# Patient Record
Sex: Male | Born: 1995 | Race: Black or African American | Hispanic: No | Marital: Single | Smoking: Never smoker
Health system: Southern US, Community
[De-identification: ages and names within clinical notes are randomized; demographics above are authoritative.]

## PROBLEM LIST (undated history)

## (undated) DIAGNOSIS — Z789 Other specified health status: Secondary | ICD-10-CM

## (undated) HISTORY — PX: NO PAST SURGERIES: SHX2092

---
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
## 2018-06-13 ENCOUNTER — Ambulatory Visit: Payer: Self-pay | Admitting: Family Medicine

## 2018-06-13 VITALS — BP 105/70 | HR 89 | Temp 98.8°F | Resp 16 | Wt 117.6 lb

## 2018-06-13 DIAGNOSIS — B081 Molluscum contagiosum: Secondary | ICD-10-CM

## 2018-06-13 NOTE — Progress Notes (Signed)
Nicholas Barr is a 22 y.o. male who presents today with concerns of a rash on his belly button, under his arms, and the most recent lesion on his penis. He denies risk for STI/STD and reports a steady girlfriend- who is symptom free.  Review of Systems  Constitutional: Negative for chills, fever and malaise/fatigue.  HENT: Negative for congestion, ear discharge, ear pain, sinus pain and sore throat.   Eyes: Negative.   Respiratory: Negative for cough, sputum production and shortness of breath.   Cardiovascular: Negative.  Negative for chest pain.  Gastrointestinal: Negative for abdominal pain, diarrhea, nausea and vomiting.  Genitourinary: Negative for dysuria, frequency, hematuria and urgency.  Musculoskeletal: Negative for myalgias.  Skin: Positive for itching and rash.  Neurological: Negative for headaches.  Endo/Heme/Allergies: Negative.   Psychiatric/Behavioral: Negative.     O: Vitals:   06/13/18 1723  BP: 105/70  Pulse: 89  Resp: 16  Temp: 98.8 F (37.1 C)  SpO2: 96%     Physical Exam  Constitutional: He is oriented to person, place, and time. Vital signs are normal. He appears well-developed and well-nourished. He is active.  Non-toxic appearance. He does not have a sickly appearance.  HENT:  Head: Normocephalic.  Right Ear: Hearing, tympanic membrane, external ear and ear canal normal.  Left Ear: Hearing, tympanic membrane, external ear and ear canal normal.  Nose: Nose normal.  Mouth/Throat: Uvula is midline and oropharynx is clear and moist.  Neck: Normal range of motion. Neck supple.  Cardiovascular: Normal rate, regular rhythm, normal heart sounds and normal pulses.  Pulmonary/Chest: Effort normal and breath sounds normal.  Abdominal: Soft. Bowel sounds are normal.  Genitourinary: Testes normal. Circumcised. No phimosis, paraphimosis, hypospadias or penile erythema. No discharge found.     Genitourinary Comments: Small non tender flesh colored lesions  consistent with central dimple and raised non crusty non plaque like vesicular edges- thigh lesions have ecchymotic appearance.  Musculoskeletal: Normal range of motion.  Lymphadenopathy:       Head (right side): No submental and no submandibular adenopathy present.       Head (left side): No submental and no submandibular adenopathy present.    He has no cervical adenopathy.  Neurological: He is alert and oriented to person, place, and time.  Skin:     Psychiatric: He has a normal mood and affect.  Vitals reviewed.    A: 1. Molluscum contagiosum      P: Discussed exam findings, diagnosis etiology and medication use and indications reviewed with patient. Follow- Up and discharge instructions provided. No emergent/urgent issues found on exam.  Patient verbalized understanding of information provided and agrees with plan of care (POC), all questions answered.  1. Molluscum contagiosum Discussed treatment plan and options and advised other locations to seek definitive treatment at location that can perform cryotherapy, etc.  Other orders - Multiple Vitamin (MULTIVITAMIN WITH MINERALS) TABS tablet; Take 1 tablet by mouth daily.

## 2018-06-13 NOTE — Patient Instructions (Signed)
Molluscum Contagiosum, Adult Molluscum contagiosum is a skin infection that can cause a rash. When molluscum contagiosum affects the genital area, it is called a sexually transmitted disease (STD). What are the causes? Molluscum contagiosum is caused by a virus. The virus can spread easily from person to person through:  Skin-to-skin contact with an infected person.  Contact with an infected object, such as a towel or clothing.  What increases the risk? You may be at higher risk for molluscum contagiosum if you:  Live in an area where the weather is moist and warm.  Have a weak body defense system (immune system).  What are the signs or symptoms? The main symptom is a rash that appears 2-7 weeks after exposure to the virus. It is made up of 2-20 small, firm, dome-shaped bumps that may:  Be pink or flesh-colored.  Appear alone or in groups.  Range from the size of a pinhead to the size of a pencil eraser.  Feel smooth and waxy.  Have a pit in the middle.  Itch. The rash does not itch for most people.  The bumps often appears on the genitals, thighs, face, neck, and abdomen. How is this diagnosed? A health care provider can usually diagnose molluscum contagiosum by looking at the bumps on your skin. To confirm the diagnosis, your health care provider may scrape the bumps to collect a skin sample to examine under a microscope. How is this treated? The bumps may go away on their own, but people often have treatment to keep the virus from infecting someone else or to keep the rash from spreading to other body parts. Treatment may include:  Surgery to remove the bumps by freezing them (cryosurgery).  A procedure to scrape off the bumps (curettage).  A procedure to remove the bumps with a laser.  Putting medicine on the bumps (topical treatment).  Sometimes no treatment is needed. Follow these instructions at home:  Take medicines only as directed by your health care  provider.  As long as you have bumps on your skin, the infection can spread to others and to other parts of your body. To prevent this from happening: ? Do not scratch the bumps. ? Do not share clothing or towels with others until the bumps disappear. ? Avoid close contact with others until the bumps disappear. This includes sexual contact. ? Wash your hands often. ? Cover the bumps with clothing or a bandage when you will be near other people. Contact a health care provider if:  The bumps are spreading.  The bumps are becoming red and sore.  The bumps have not gone away after 12 months. This information is not intended to replace advice given to you by your health care provider. Make sure you discuss any questions you have with your health care provider. Document Released: 04/17/2014 Document Revised: 02/26/2016 Document Reviewed: 02/27/2014 Elsevier Interactive Patient Education  2018 [employer]  

## 2019-04-28 ENCOUNTER — Encounter (HOSPITAL_COMMUNITY): Payer: Self-pay | Admitting: Emergency Medicine

## 2019-04-28 ENCOUNTER — Emergency Department (HOSPITAL_COMMUNITY): Payer: BC Managed Care – PPO

## 2019-04-28 ENCOUNTER — Other Ambulatory Visit: Payer: Self-pay

## 2019-04-28 ENCOUNTER — Emergency Department (HOSPITAL_COMMUNITY)
Admission: EM | Admit: 2019-04-28 | Discharge: 2019-04-28 | Disposition: A | Discharge status: Home / Self Care | Payer: BC Managed Care – PPO | Attending: Emergency Medicine | Admitting: Emergency Medicine

## 2019-04-28 DIAGNOSIS — Z5321 Procedure and treatment not carried out due to patient leaving prior to being seen by health care provider: Secondary | ICD-10-CM | POA: Diagnosis not present

## 2019-04-28 DIAGNOSIS — R079 Chest pain, unspecified: Secondary | ICD-10-CM | POA: Insufficient documentation

## 2019-04-28 LAB — CBC
HCT: 46.9 % (ref 39.0–52.0)
Hemoglobin: 15.5 g/dL (ref 13.0–17.0)
MCH: 30.3 pg (ref 26.0–34.0)
MCHC: 33 g/dL (ref 30.0–36.0)
MCV: 91.8 fL (ref 80.0–100.0)
Platelets: 211 10*3/uL (ref 150–400)
RBC: 5.11 MIL/uL (ref 4.22–5.81)
RDW: 11.2 % — ABNORMAL LOW (ref 11.5–15.5)
WBC: 9.2 10*3/uL (ref 4.0–10.5)
nRBC: 0 % (ref 0.0–0.2)

## 2019-04-28 LAB — BASIC METABOLIC PANEL
Anion gap: 12 (ref 5–15)
BUN: 14 mg/dL (ref 6–20)
CO2: 22 mmol/L (ref 22–32)
Calcium: 9 mg/dL (ref 8.9–10.3)
Chloride: 102 mmol/L (ref 98–111)
Creatinine, Ser: 1.14 mg/dL (ref 0.61–1.24)
GFR calc Af Amer: >60 mL/min (ref >60)
GFR calc non Af Amer: >60 mL/min (ref >60)
Glucose, Bld: 144 mg/dL — ABNORMAL HIGH (ref 70–99)
Potassium: 3.6 mmol/L (ref 3.5–5.1)
Sodium: 136 mmol/L (ref 135–145)

## 2019-04-28 LAB — TROPONIN I (HIGH SENSITIVITY): Troponin I (High Sensitivity): <2 ng/L (ref <18)

## 2019-04-28 MED ORDER — SODIUM CHLORIDE 0.9% FLUSH: 3.0000 mL | Freq: Once | INTRAVENOUS | Status: DC

## 2019-04-28 NOTE — ED Notes (Signed)
Called pt name for vitals no response  

## 2019-04-28 NOTE — ED Notes (Signed)
Pt did not answer for troponin redraw. Pt called in the lobby

## 2019-04-28 NOTE — ED Notes (Signed)
Pt did not answer for troponin blood redraw in lobby

## 2019-04-28 NOTE — ED Triage Notes (Signed)
Patient with shortness of breath and chest pain.  He states that it started a few hours ago.  Patient denies any fevers, no nausea or vomiting.

## 2019-04-28 NOTE — ED Notes (Signed)
Called x 3 no answer 

## 2020-10-08 DIAGNOSIS — Z03818 Encounter for observation for suspected exposure to other biological agents ruled out: Secondary | ICD-10-CM | POA: Diagnosis not present

## 2020-10-11 DIAGNOSIS — Z1152 Encounter for screening for COVID-19: Secondary | ICD-10-CM | POA: Diagnosis not present

## 2020-12-22 DIAGNOSIS — Z0389 Encounter for observation for other suspected diseases and conditions ruled out: Secondary | ICD-10-CM | POA: Diagnosis not present

## 2020-12-22 DIAGNOSIS — Z1388 Encounter for screening for disorder due to exposure to contaminants: Secondary | ICD-10-CM | POA: Diagnosis not present

## 2020-12-22 DIAGNOSIS — Z3009 Encounter for other general counseling and advice on contraception: Secondary | ICD-10-CM | POA: Diagnosis not present

## 2020-12-23 IMAGING — CR CHEST - 2 VIEW
2 series · 2 of 2 positions shown · non-contrast
Comparison: None.

CLINICAL DATA: 23-year-old male with chest pain.

EXAM:
CHEST - 2 VIEW

[chest pa]
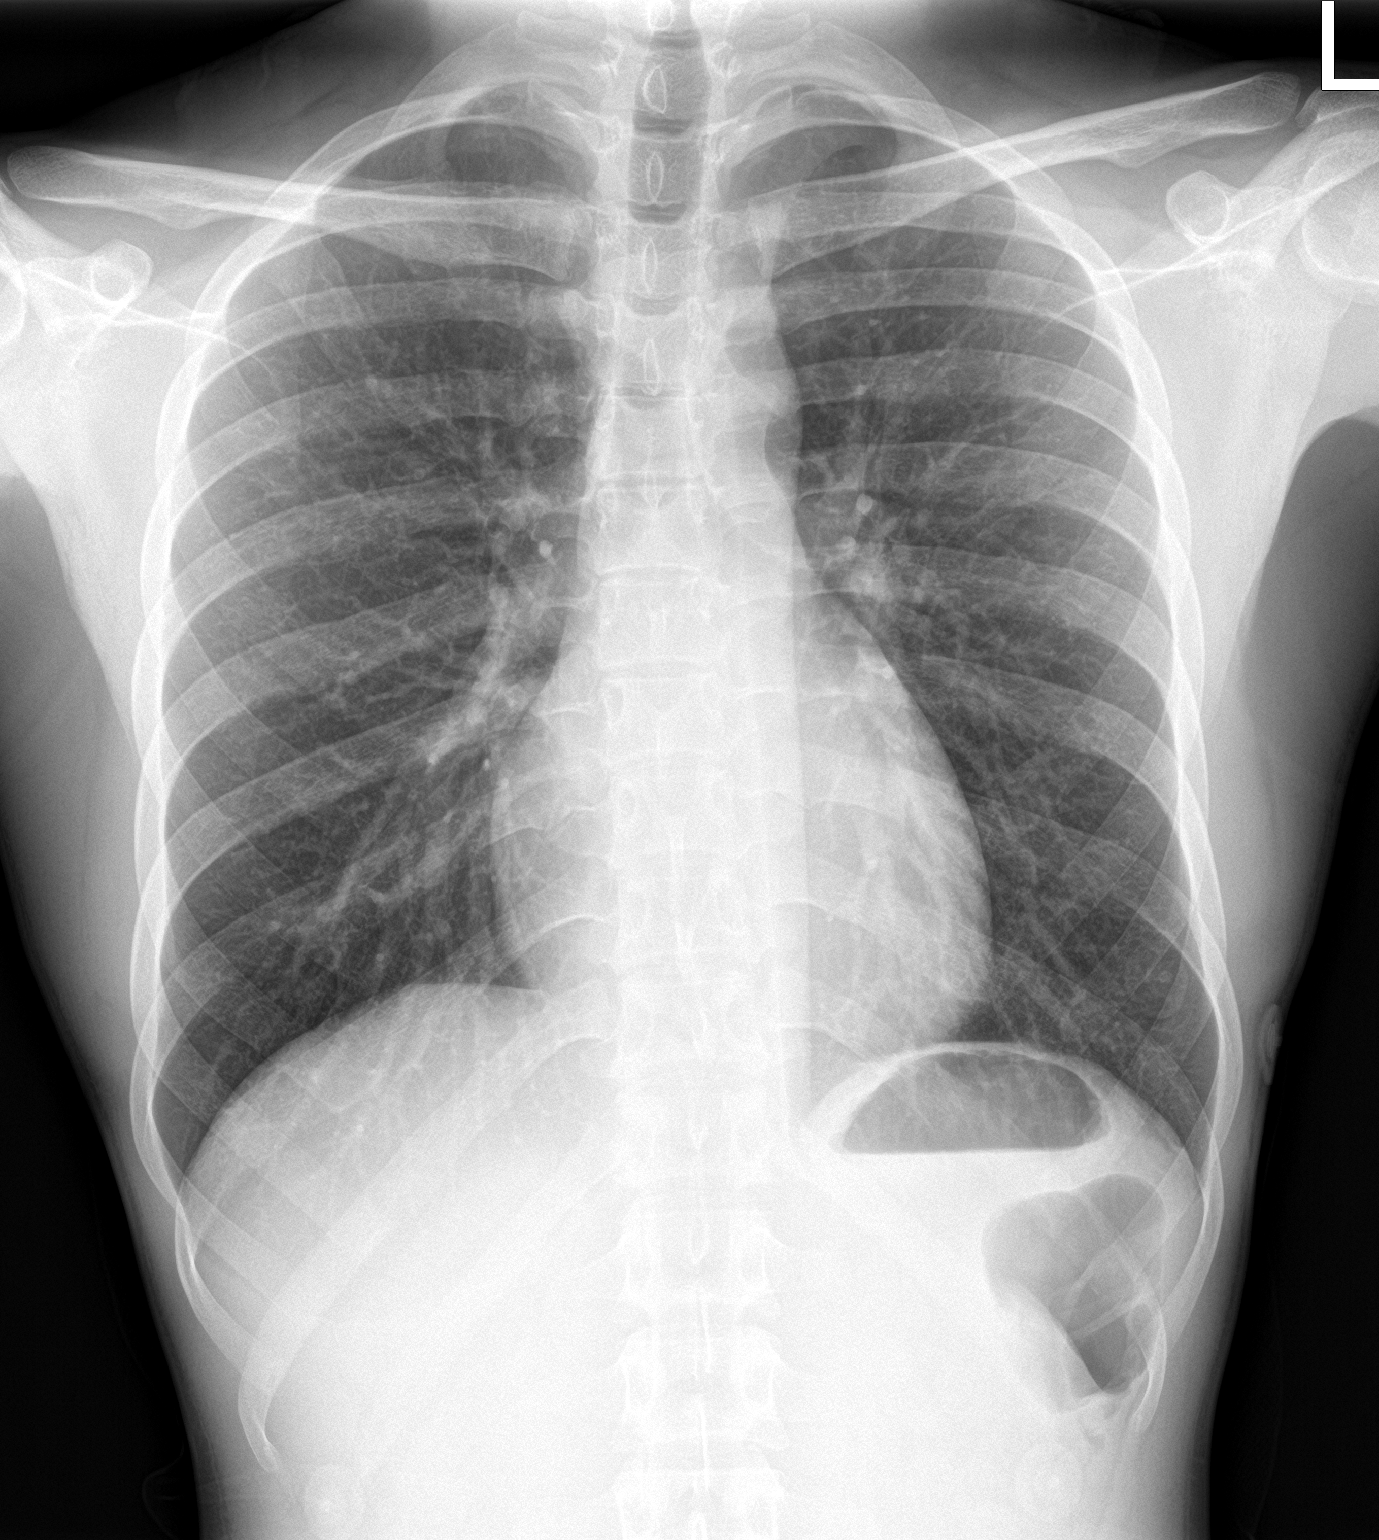

[chest lat]
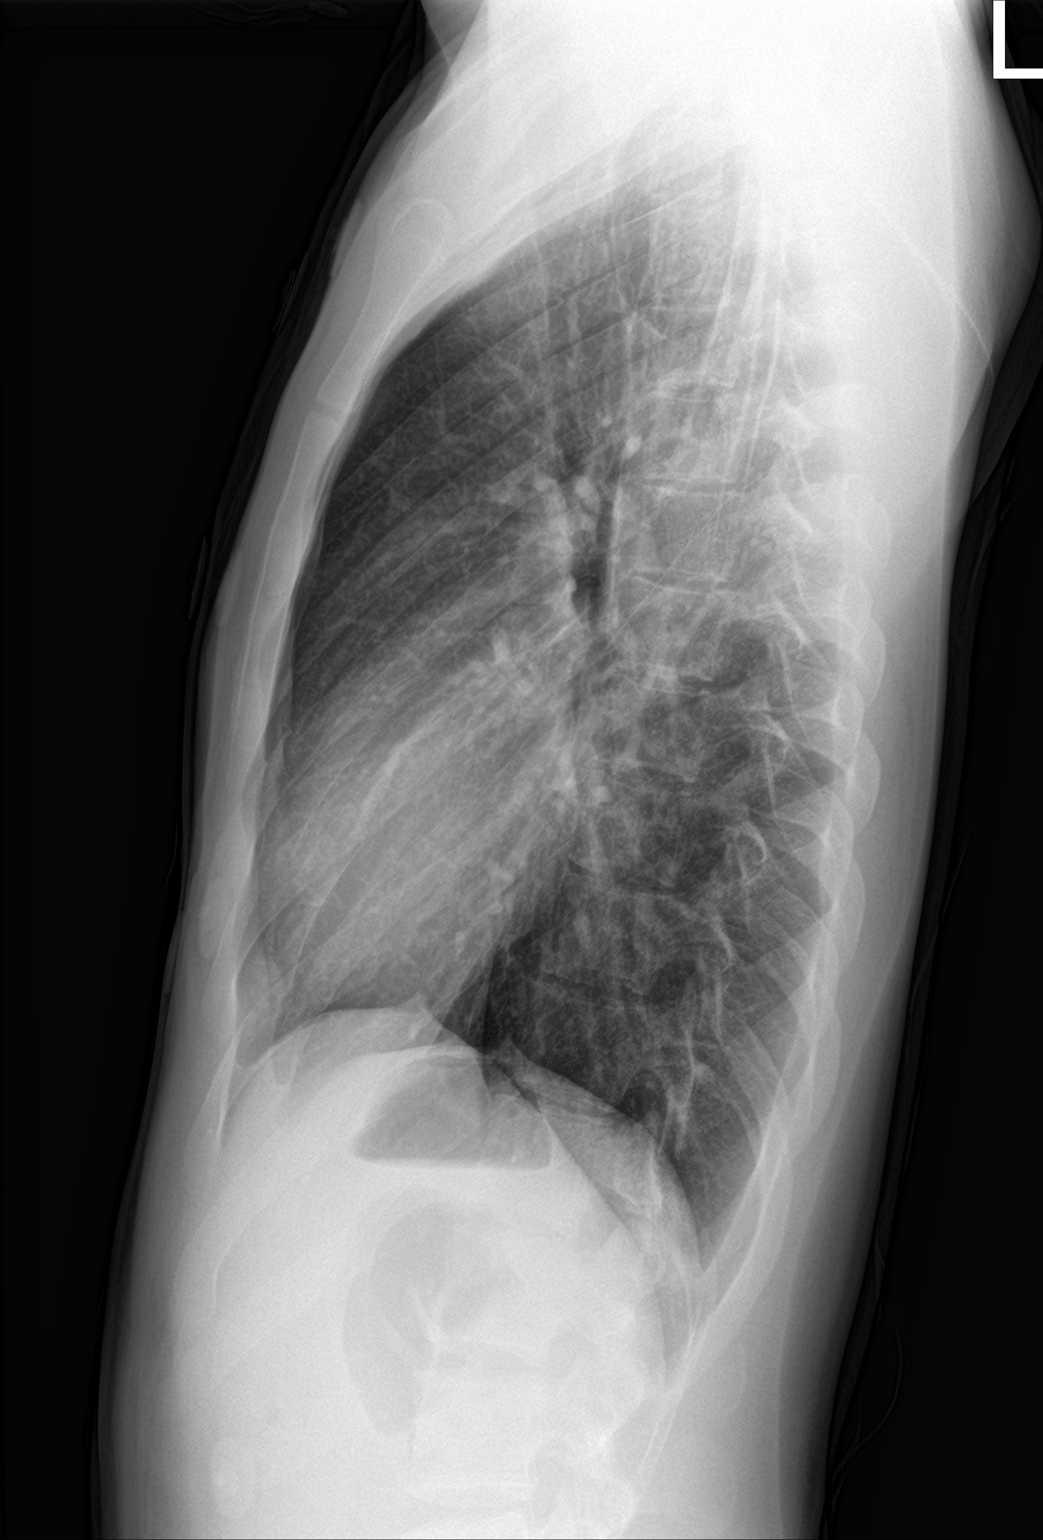

[2 of 2 positions shown; findings below may reference images not displayed]

FINDINGS: The heart size and mediastinal contours are within normal limits.
Both lungs are clear. The visualized skeletal structures are
unremarkable.
IMPRESSION: No active cardiopulmonary disease.

## 2022-09-03 DIAGNOSIS — Z419 Encounter for procedure for purposes other than remedying health state, unspecified: Secondary | ICD-10-CM | POA: Diagnosis not present

## 2022-10-04 DIAGNOSIS — Z419 Encounter for procedure for purposes other than remedying health state, unspecified: Secondary | ICD-10-CM | POA: Diagnosis not present
